# Patient Record
Sex: Male | Born: 1965 | Race: White | Hispanic: No | Marital: Married
Health system: Southern US, Community
[De-identification: ages and names within clinical notes are randomized; demographics above are authoritative.]

---

## 2010-03-15 ENCOUNTER — Encounter
Admission: RE | Admit: 2010-03-15 | Discharge: 2010-03-15 | Payer: Self-pay | Source: Home / Self Care | Attending: Internal Medicine | Admitting: Internal Medicine

## 2010-03-15 ENCOUNTER — Other Ambulatory Visit
Admission: RE | Admit: 2010-03-15 | Discharge: 2010-03-15 | Payer: Self-pay | Source: Home / Self Care | Admitting: Diagnostic Radiology

## 2011-03-11 ENCOUNTER — Other Ambulatory Visit: Payer: Self-pay | Admitting: Internal Medicine

## 2011-03-11 DIAGNOSIS — E049 Nontoxic goiter, unspecified: Secondary | ICD-10-CM

## 2011-04-11 ENCOUNTER — Ambulatory Visit
Admission: RE | Admit: 2011-04-11 | Discharge: 2011-04-11 | Disposition: A | Discharge status: Home / Self Care | Payer: BC Managed Care – PPO | Source: Ambulatory Visit | Attending: Internal Medicine | Admitting: Internal Medicine

## 2011-04-11 DIAGNOSIS — E049 Nontoxic goiter, unspecified: Secondary | ICD-10-CM

## 2011-07-13 IMAGING — US US SOFT TISSUE HEAD/NECK
1 series · 13 of 25 positions shown · non-contrast
Comparison: None.

CLINICAL DATA: 44-year-old with palpable thyroid mass.  Scheduled
for biopsy.

THYROID ULTRASOUND
TECHNIQUE: Ultrasound examination of the thyroid gland and adjacent
soft tissues was performed.

[Series 1: us soft tissue head/neck · 0.07mm/px · 13 of 42 slices shown]
[im 1/42]
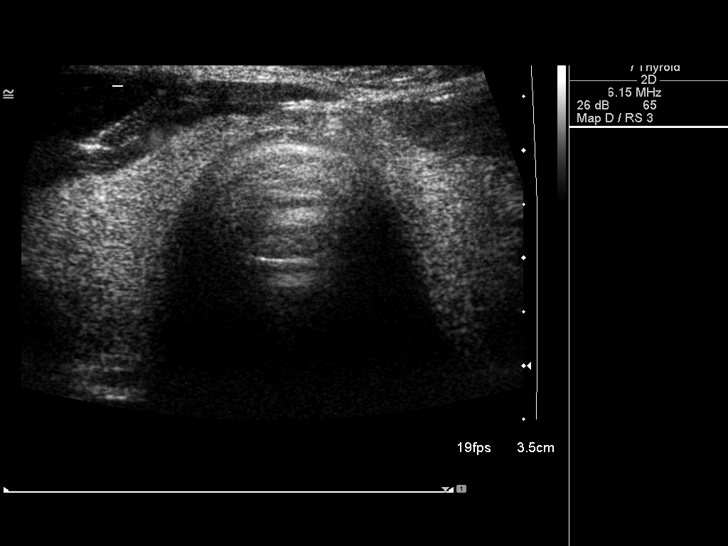
[im 4/42]
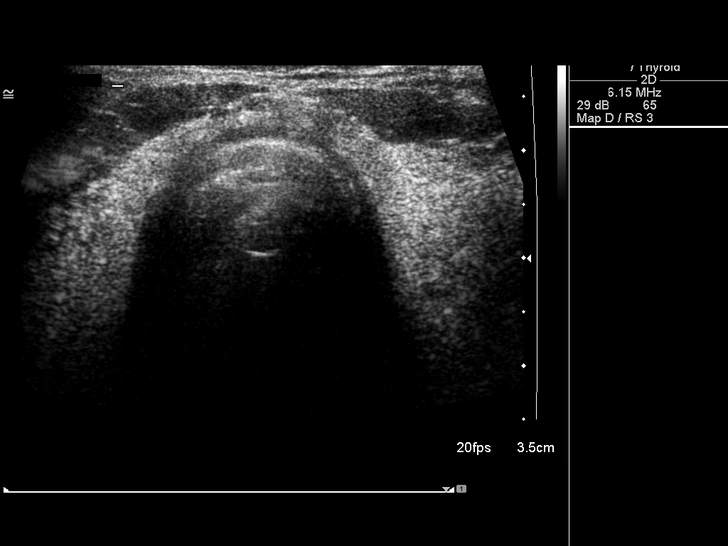
[im 7/42]
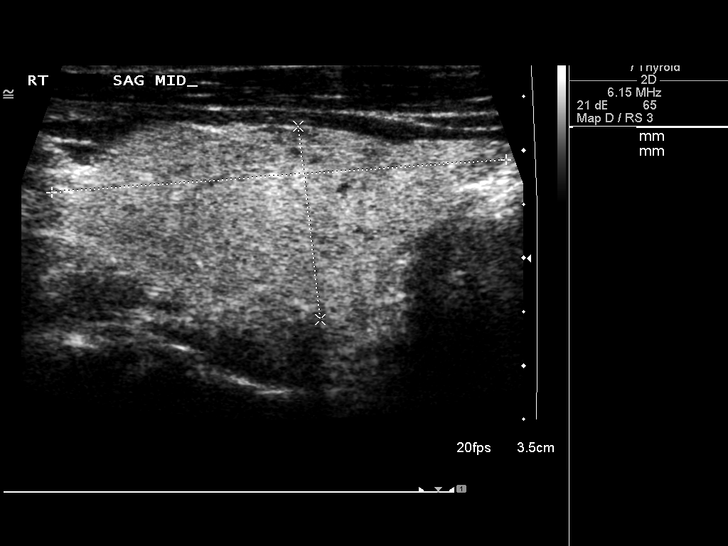
[im 11/42]
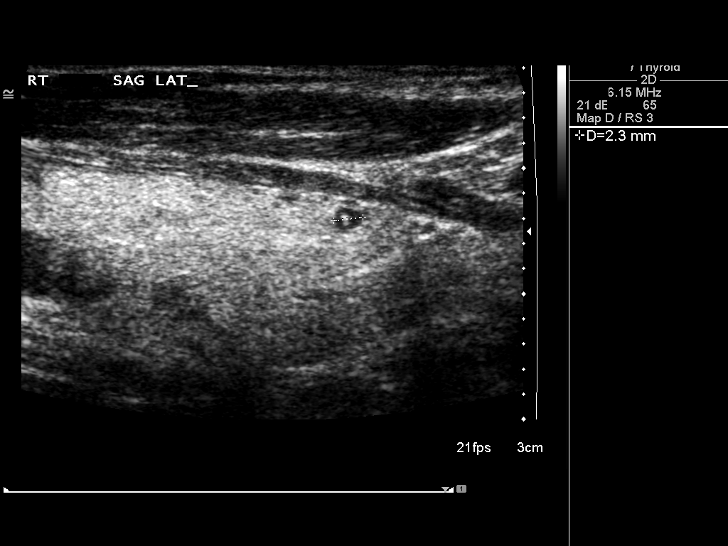
[im 14/42]
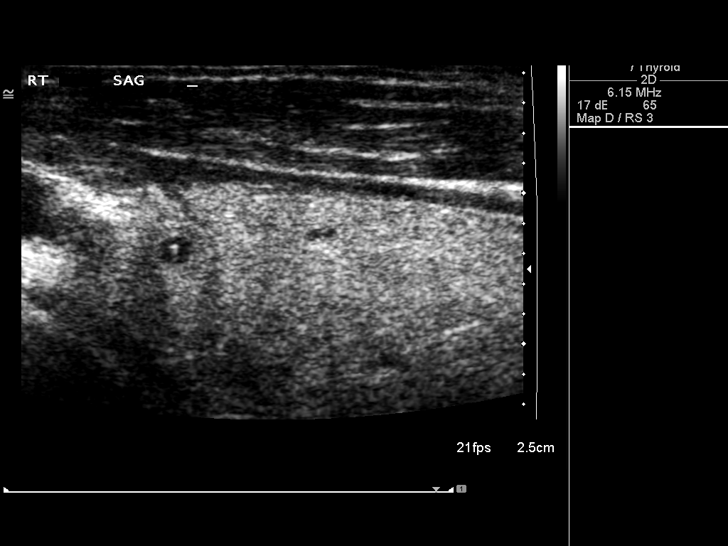
[im 18/42]
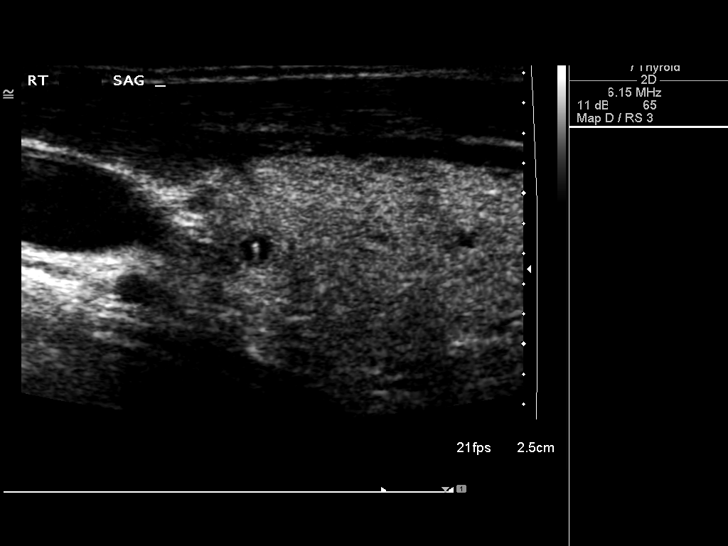
[im 21/42]
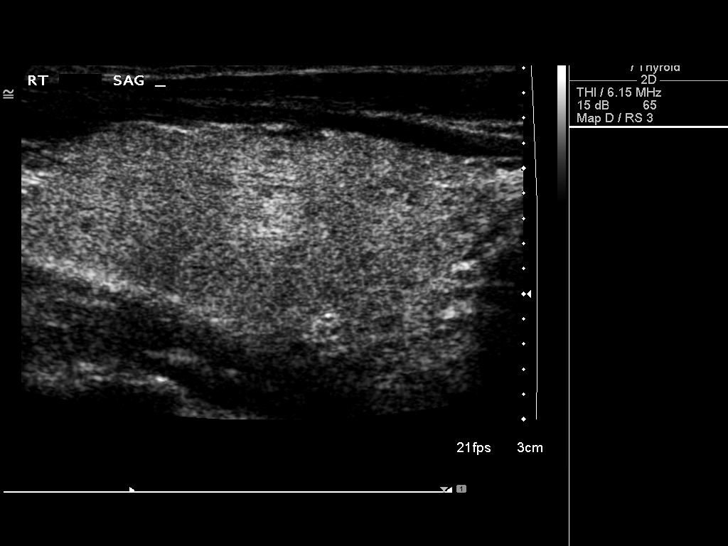
[im 24/42]
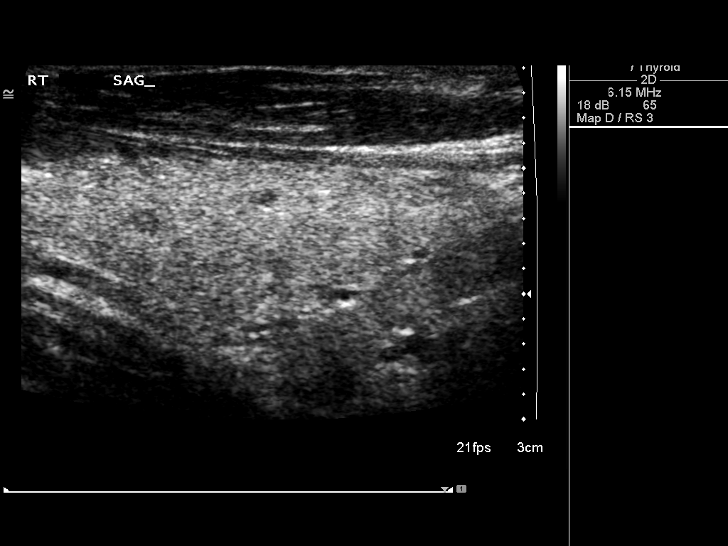
[im 28/42]
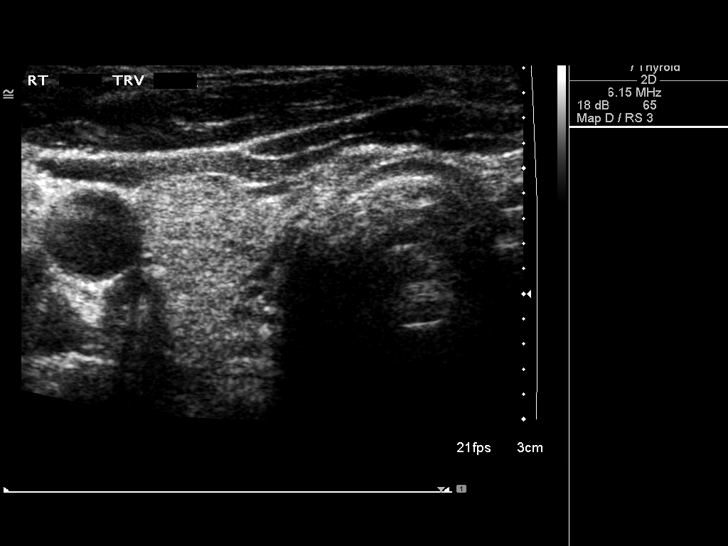
[im 31/42]
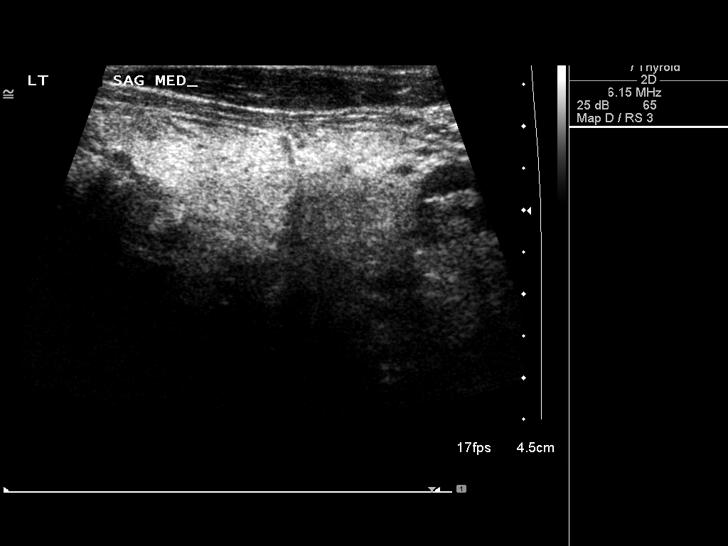
[im 35/42]
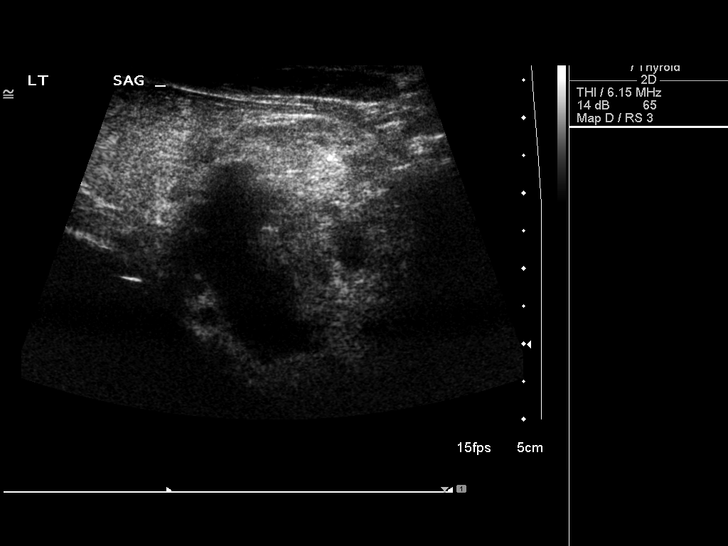
[im 38/42]
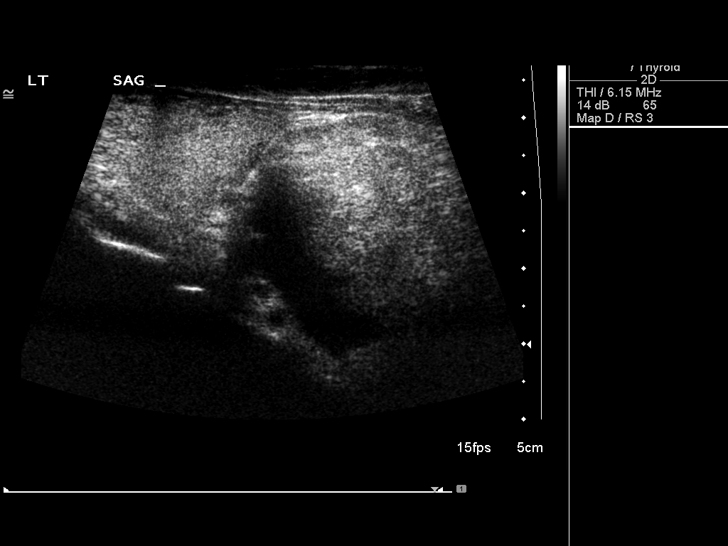
[im 42/42]
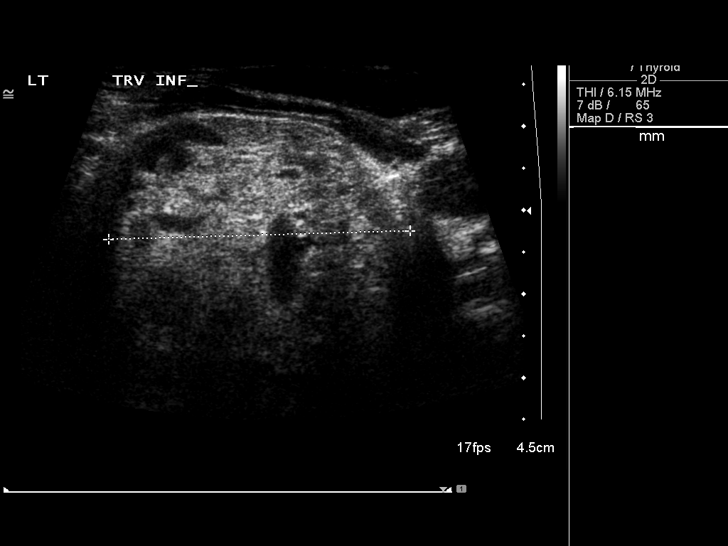

[13 of 25 positions shown; findings below may reference images not displayed]

FINDINGS: Right thyroid lobe:  Measures 4.2 x 1.8 x 1.7 cm.  The right
thyroid tissue is heterogeneous.  Small scattered nodules in the
right thyroid lobe.

Left thyroid lobe:  Measures 6.9 x 4.0 x 2.0 cm
Isthmus:  0.3 cm

Focal nodules:  Largest right thyroid nodule is along the superior
aspect and measures 0.8 x 0.6 x 0.7 cm.  This nodule is
heterogeneous and ill-defined.  There is a dominant nodule along
the inferior left thyroid lobe that extends into the retrosternal
space.  This is large solid nodule has a cystic component.  No
significant calcifications within the nodule.  The left thyroid
nodule measures 4.3 x 3.5 x 3.6 cm.

Lymphadenopathy:  None visualized.
IMPRESSION: Bilateral thyroid nodules.  There is a dominant solid nodule in the
left thyroid lobe that meets the criteria for fine needle
aspiration.

Multiple right thyroid nodules and the largest measures up to
cm.

This recommendation follows the consensus statement:  Management of
Thyroid Nodules Detected as US:  Society of Radiologists in
800.  Available online at :
[URL]

## 2011-10-25 ENCOUNTER — Other Ambulatory Visit (HOSPITAL_COMMUNITY): Payer: Self-pay | Admitting: Endocrinology

## 2011-10-25 DIAGNOSIS — E049 Nontoxic goiter, unspecified: Secondary | ICD-10-CM

## 2011-10-27 ENCOUNTER — Encounter (HOSPITAL_COMMUNITY)
Admission: RE | Admit: 2011-10-27 | Discharge: 2011-10-27 | Disposition: A | Discharge status: Home / Self Care | Payer: BC Managed Care – PPO | Source: Ambulatory Visit | Attending: Endocrinology | Admitting: Endocrinology

## 2011-10-27 DIAGNOSIS — E041 Nontoxic single thyroid nodule: Secondary | ICD-10-CM | POA: Insufficient documentation

## 2011-10-27 DIAGNOSIS — E049 Nontoxic goiter, unspecified: Secondary | ICD-10-CM

## 2011-10-27 MED ORDER — SODIUM PERTECHNETATE TC 99M INJECTION
10.0000 | Freq: Once | INTRAVENOUS | Status: AC | PRN
  Administered 2011-10-27: 10 via INTRAVENOUS

## 2016-05-02 DIAGNOSIS — F411 Generalized anxiety disorder: Secondary | ICD-10-CM | POA: Diagnosis not present

## 2016-05-02 DIAGNOSIS — Z8781 Personal history of (healed) traumatic fracture: Secondary | ICD-10-CM | POA: Diagnosis not present

## 2016-05-02 DIAGNOSIS — M545 Low back pain: Secondary | ICD-10-CM | POA: Diagnosis not present

## 2016-05-02 DIAGNOSIS — Z0001 Encounter for general adult medical examination with abnormal findings: Secondary | ICD-10-CM | POA: Diagnosis not present

## 2016-05-02 DIAGNOSIS — Z1212 Encounter for screening for malignant neoplasm of rectum: Secondary | ICD-10-CM | POA: Diagnosis not present

## 2016-12-01 DIAGNOSIS — F320 Major depressive disorder, single episode, mild: Secondary | ICD-10-CM | POA: Diagnosis not present

## 2016-12-01 DIAGNOSIS — M7072 Other bursitis of hip, left hip: Secondary | ICD-10-CM | POA: Diagnosis not present

## 2016-12-01 DIAGNOSIS — E039 Hypothyroidism, unspecified: Secondary | ICD-10-CM | POA: Diagnosis not present

## 2016-12-01 DIAGNOSIS — M25552 Pain in left hip: Secondary | ICD-10-CM | POA: Diagnosis not present

## 2016-12-07 DIAGNOSIS — G8929 Other chronic pain: Secondary | ICD-10-CM | POA: Diagnosis not present

## 2016-12-07 DIAGNOSIS — M25522 Pain in left elbow: Secondary | ICD-10-CM | POA: Diagnosis not present

## 2016-12-07 DIAGNOSIS — M25552 Pain in left hip: Secondary | ICD-10-CM | POA: Diagnosis not present

## 2017-01-04 DIAGNOSIS — E039 Hypothyroidism, unspecified: Secondary | ICD-10-CM | POA: Diagnosis not present

## 2017-01-04 DIAGNOSIS — M1611 Unilateral primary osteoarthritis, right hip: Secondary | ICD-10-CM | POA: Diagnosis not present

## 2017-01-04 DIAGNOSIS — F320 Major depressive disorder, single episode, mild: Secondary | ICD-10-CM | POA: Diagnosis not present

## 2017-01-04 DIAGNOSIS — Z23 Encounter for immunization: Secondary | ICD-10-CM | POA: Diagnosis not present

## 2017-02-14 DIAGNOSIS — E890 Postprocedural hypothyroidism: Secondary | ICD-10-CM | POA: Diagnosis not present

## 2017-02-14 DIAGNOSIS — F411 Generalized anxiety disorder: Secondary | ICD-10-CM | POA: Diagnosis not present

## 2017-05-04 DIAGNOSIS — Z0001 Encounter for general adult medical examination with abnormal findings: Secondary | ICD-10-CM | POA: Diagnosis not present

## 2017-05-04 DIAGNOSIS — N401 Enlarged prostate with lower urinary tract symptoms: Secondary | ICD-10-CM | POA: Diagnosis not present

## 2017-05-04 DIAGNOSIS — E890 Postprocedural hypothyroidism: Secondary | ICD-10-CM | POA: Diagnosis not present

## 2017-05-09 DIAGNOSIS — M545 Low back pain: Secondary | ICD-10-CM | POA: Diagnosis not present

## 2017-05-09 DIAGNOSIS — Z1212 Encounter for screening for malignant neoplasm of rectum: Secondary | ICD-10-CM | POA: Diagnosis not present

## 2017-05-09 DIAGNOSIS — F320 Major depressive disorder, single episode, mild: Secondary | ICD-10-CM | POA: Diagnosis not present

## 2017-05-09 DIAGNOSIS — Z0001 Encounter for general adult medical examination with abnormal findings: Secondary | ICD-10-CM | POA: Diagnosis not present

## 2017-05-09 DIAGNOSIS — F411 Generalized anxiety disorder: Secondary | ICD-10-CM | POA: Diagnosis not present

## 2017-05-09 DIAGNOSIS — L570 Actinic keratosis: Secondary | ICD-10-CM | POA: Diagnosis not present

## 2017-05-09 DIAGNOSIS — M40294 Other kyphosis, thoracic region: Secondary | ICD-10-CM | POA: Diagnosis not present

## 2017-09-20 DIAGNOSIS — M47816 Spondylosis without myelopathy or radiculopathy, lumbar region: Secondary | ICD-10-CM | POA: Diagnosis not present

## 2017-09-20 DIAGNOSIS — M9903 Segmental and somatic dysfunction of lumbar region: Secondary | ICD-10-CM | POA: Diagnosis not present

## 2017-10-03 DIAGNOSIS — M47816 Spondylosis without myelopathy or radiculopathy, lumbar region: Secondary | ICD-10-CM | POA: Diagnosis not present

## 2017-10-03 DIAGNOSIS — M9903 Segmental and somatic dysfunction of lumbar region: Secondary | ICD-10-CM | POA: Diagnosis not present

## 2017-10-05 DIAGNOSIS — M47816 Spondylosis without myelopathy or radiculopathy, lumbar region: Secondary | ICD-10-CM | POA: Diagnosis not present

## 2017-10-05 DIAGNOSIS — M9903 Segmental and somatic dysfunction of lumbar region: Secondary | ICD-10-CM | POA: Diagnosis not present

## 2017-10-10 DIAGNOSIS — M47816 Spondylosis without myelopathy or radiculopathy, lumbar region: Secondary | ICD-10-CM | POA: Diagnosis not present

## 2017-10-10 DIAGNOSIS — M9903 Segmental and somatic dysfunction of lumbar region: Secondary | ICD-10-CM | POA: Diagnosis not present

## 2017-10-17 DIAGNOSIS — M47816 Spondylosis without myelopathy or radiculopathy, lumbar region: Secondary | ICD-10-CM | POA: Diagnosis not present

## 2017-10-17 DIAGNOSIS — M9903 Segmental and somatic dysfunction of lumbar region: Secondary | ICD-10-CM | POA: Diagnosis not present

## 2017-10-18 DIAGNOSIS — M47816 Spondylosis without myelopathy or radiculopathy, lumbar region: Secondary | ICD-10-CM | POA: Diagnosis not present

## 2017-10-18 DIAGNOSIS — M9903 Segmental and somatic dysfunction of lumbar region: Secondary | ICD-10-CM | POA: Diagnosis not present

## 2017-10-23 DIAGNOSIS — M47816 Spondylosis without myelopathy or radiculopathy, lumbar region: Secondary | ICD-10-CM | POA: Diagnosis not present

## 2017-10-23 DIAGNOSIS — M9903 Segmental and somatic dysfunction of lumbar region: Secondary | ICD-10-CM | POA: Diagnosis not present

## 2017-10-25 DIAGNOSIS — M9903 Segmental and somatic dysfunction of lumbar region: Secondary | ICD-10-CM | POA: Diagnosis not present

## 2017-10-25 DIAGNOSIS — M47816 Spondylosis without myelopathy or radiculopathy, lumbar region: Secondary | ICD-10-CM | POA: Diagnosis not present

## 2017-11-06 DIAGNOSIS — M47816 Spondylosis without myelopathy or radiculopathy, lumbar region: Secondary | ICD-10-CM | POA: Diagnosis not present

## 2017-11-06 DIAGNOSIS — M9903 Segmental and somatic dysfunction of lumbar region: Secondary | ICD-10-CM | POA: Diagnosis not present

## 2017-11-07 DIAGNOSIS — S61215A Laceration without foreign body of left ring finger without damage to nail, initial encounter: Secondary | ICD-10-CM | POA: Diagnosis not present

## 2017-11-08 DIAGNOSIS — M47816 Spondylosis without myelopathy or radiculopathy, lumbar region: Secondary | ICD-10-CM | POA: Diagnosis not present

## 2017-11-08 DIAGNOSIS — M9903 Segmental and somatic dysfunction of lumbar region: Secondary | ICD-10-CM | POA: Diagnosis not present

## 2017-11-14 DIAGNOSIS — S61214D Laceration without foreign body of right ring finger without damage to nail, subsequent encounter: Secondary | ICD-10-CM | POA: Diagnosis not present

## 2017-11-14 DIAGNOSIS — M9903 Segmental and somatic dysfunction of lumbar region: Secondary | ICD-10-CM | POA: Diagnosis not present

## 2017-11-14 DIAGNOSIS — M47816 Spondylosis without myelopathy or radiculopathy, lumbar region: Secondary | ICD-10-CM | POA: Diagnosis not present

## 2017-11-20 DIAGNOSIS — M47816 Spondylosis without myelopathy or radiculopathy, lumbar region: Secondary | ICD-10-CM | POA: Diagnosis not present

## 2017-11-20 DIAGNOSIS — M9903 Segmental and somatic dysfunction of lumbar region: Secondary | ICD-10-CM | POA: Diagnosis not present

## 2017-11-29 DIAGNOSIS — M47816 Spondylosis without myelopathy or radiculopathy, lumbar region: Secondary | ICD-10-CM | POA: Diagnosis not present

## 2017-11-29 DIAGNOSIS — M9903 Segmental and somatic dysfunction of lumbar region: Secondary | ICD-10-CM | POA: Diagnosis not present

## 2017-12-11 DIAGNOSIS — M47816 Spondylosis without myelopathy or radiculopathy, lumbar region: Secondary | ICD-10-CM | POA: Diagnosis not present

## 2017-12-11 DIAGNOSIS — M9903 Segmental and somatic dysfunction of lumbar region: Secondary | ICD-10-CM | POA: Diagnosis not present

## 2017-12-25 ENCOUNTER — Ambulatory Visit (INDEPENDENT_AMBULATORY_CARE_PROVIDER_SITE_OTHER): Payer: BLUE CROSS/BLUE SHIELD | Admitting: Orthopaedic Surgery

## 2017-12-25 ENCOUNTER — Other Ambulatory Visit (INDEPENDENT_AMBULATORY_CARE_PROVIDER_SITE_OTHER): Payer: Self-pay

## 2017-12-25 ENCOUNTER — Encounter (INDEPENDENT_AMBULATORY_CARE_PROVIDER_SITE_OTHER): Payer: Self-pay | Admitting: Orthopaedic Surgery

## 2017-12-25 ENCOUNTER — Ambulatory Visit (INDEPENDENT_AMBULATORY_CARE_PROVIDER_SITE_OTHER): Payer: Self-pay

## 2017-12-25 DIAGNOSIS — M5432 Sciatica, left side: Secondary | ICD-10-CM

## 2017-12-25 DIAGNOSIS — M25552 Pain in left hip: Secondary | ICD-10-CM

## 2017-12-25 DIAGNOSIS — M79605 Pain in left leg: Secondary | ICD-10-CM

## 2017-12-25 DIAGNOSIS — M4807 Spinal stenosis, lumbosacral region: Secondary | ICD-10-CM

## 2017-12-25 NOTE — Progress Notes (Signed)
Office Visit Note   Patient: Matthew Ayala           Date of Birth: 18-Aug-1965           MRN: [id number] Visit Date: 12/25/2017              Requested by: No referring provider defined for this encounter. PCP: [facility]   Assessment & Plan: Visit Diagnoses:  1. Pain in left hip   2. Pain in left leg   3. Sciatica, left side     Plan: Based on his signs and symptoms and clinical exam I do feel this is more lumbar spine related.  He is already tried and failed all forms of conservative treatment for over a year now including therapy with a chiropractor, activity modification, anti-inflammatories and rest.  He continues to have the neurologic symptoms going on his left leg and at this point an MRI is warranted to rule out nerve compression.  All question concerns were answered and addressed.  We will see him back once we have the MRI of his lumbar spine.  Follow-Up Instructions: Return in about 2 weeks (around 01/08/2018).   Orders:  Orders Placed This Encounter  Procedures  . XR HIP UNILAT W OR W/O PELVIS 1V LEFT   No orders of the defined types were placed in this encounter.     Procedures: No procedures performed   Clinical Data: No additional findings.   Subjective: Chief Complaint  Patient presents with  . Left Hip - Pain  The patient is a very pleasant 52 year old thin gentleman who is been dealing with left hip pain for about a year now.  However he describes it is not pain in the groin.  He did see a specialist in [place] for this and I put him on anti-inflammatories.  He has seen a chiropractor for over a year now and he does have recent x-rays that accompany him of his lumbar spine.  He denies any specific injuries.  It is gotten to where the pain is slowly getting worse and he states that it is detrimentally affecting his quality of life and his activities daily living.  He has been on anti-inflammatories and that has not helped.  In  spite of a number of chiropractic therapy treatments he continued to have problems with the pain.  He describes numbness and tingling that starts in the thigh area on the inner part of his thigh and goes all the way down to his foot.  He is not a diabetic he has no previous history of neuropathy.  He denies any weakness.  HPI  Review of Systems He currently denies any headache, chest pain, shortness of breath, fever, chills, nausea, vomiting.  Objective: Vital Signs: There were no vitals taken for this visit.  Physical Exam He is alert and oriented x3 and in no acute distress Ortho Exam Examination of both hips show normal with fluid range of motion of both hips with no pain at all.  He can easily cross his left leg over his right.  I can put him to the extremes of internal and external rotation of the left hip and this causes no pain in the groin at all.  He has no pain of the trochanteric area or the IT band.  He has no weakness in his leg but there is subjective numbness down the inner thigh to the outer part of his left leg.  He does have a positive straight leg raise.  This is on the left side. Specialty Comments:  No specialty comments available.  Imaging: Xr Hip Unilat W Or W/o Pelvis 1v Left  Result Date: 12/25/2017 An AP pelvis and lateral left hip shows no acute findings.  The joint space is well-maintained.  I was able to independently review x-rays of his lumbar spine that accompany him.  On the lateral view he does appear to have a transitional vertebra with 6 lumbar vertebra.  There is degenerative changes at that level.  PMFS History: There are no active problems to display for this patient.  History reviewed. No pertinent past medical history.  History reviewed. No pertinent family history.  History reviewed. No pertinent surgical history. Social History   Occupational History  . Not on file  Tobacco Use  . Smoking status: Not on file  Substance and Sexual Activity   . Alcohol use: Not on file  . Drug use: Not on file  . Sexual activity: Not on file

## 2017-12-27 ENCOUNTER — Telehealth (INDEPENDENT_AMBULATORY_CARE_PROVIDER_SITE_OTHER): Payer: Self-pay | Admitting: Orthopaedic Surgery

## 2017-12-27 NOTE — Telephone Encounter (Signed)
Patient called requesting that his MRI be moved to [facility] facility from [facility] due to him living in [place].  CB#[phone].  Thank you.

## 2017-12-29 NOTE — Telephone Encounter (Signed)
re-faxed

## 2017-12-29 NOTE — Telephone Encounter (Signed)
Please refax

## 2017-12-29 NOTE — Telephone Encounter (Signed)
[facility] [place]  [phone] MRI order    Facility stated they cant find fax please fax to number provided above. See message below it was sent on 2nd of October.

## 2018-01-02 DIAGNOSIS — M48061 Spinal stenosis, lumbar region without neurogenic claudication: Secondary | ICD-10-CM | POA: Diagnosis not present

## 2018-01-09 ENCOUNTER — Ambulatory Visit (INDEPENDENT_AMBULATORY_CARE_PROVIDER_SITE_OTHER): Payer: BLUE CROSS/BLUE SHIELD | Admitting: Orthopaedic Surgery

## 2018-01-09 ENCOUNTER — Other Ambulatory Visit (INDEPENDENT_AMBULATORY_CARE_PROVIDER_SITE_OTHER): Payer: Self-pay

## 2018-01-09 ENCOUNTER — Encounter (INDEPENDENT_AMBULATORY_CARE_PROVIDER_SITE_OTHER): Payer: Self-pay | Admitting: Orthopaedic Surgery

## 2018-01-09 VITALS — Ht 72.0 in | Wt 153.0 lb

## 2018-01-09 DIAGNOSIS — M5442 Lumbago with sciatica, left side: Secondary | ICD-10-CM

## 2018-01-09 DIAGNOSIS — G8929 Other chronic pain: Secondary | ICD-10-CM

## 2018-01-09 DIAGNOSIS — M545 Low back pain: Principal | ICD-10-CM

## 2018-01-09 NOTE — Progress Notes (Signed)
HPI: Mr. Matthew Ayala returns today for follow-up of his lumbar spine MRI.  He had MRI performed imaging center with weight for his [facility] actual images are not available.  Only the report is available. Patient continues to have left hip pain that radiates down to the medial aspect of the lower leg does not go into his foot.  He states the pain begins after he is been up standing or walking for 2 or 3 hours.  He has no pain when lying down or sitting.  States he is able to ride long distances without any hip or back pain.  Once his pain does begin he sits down his pain usually goes away.  He denies any waking pain.  Denies any bowel bladder dysfunction.  MRI is reviewed lesion of the lumbar spine for visualization purposes.  MRI of the lumbar spine dated 01/02/2018 showed L4-5 broad-based disc bulge and right subarticular disc protrusion annular fissure and facet hypertrophy with mild bilateral foraminal stenosis and moderate right and mild left narrowing of the lateral recess.  Physical exam: Full range of motion bilateral hips without pain.  Negative straight leg raise bilaterally.  Hamstrings are tight bilaterally.  5 out of 5 strength throughout lower extremities against resistance.  Sensation intact bilateral feet to light touch.  Dorsal pedal pulse and posterior tibial pulses are present and equal bilaterally. 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  Plan: We will send him for epidural steroid injections/L4-5 facet injection on the left with Dr. Alvester Morin.  I did explain to him that he will need to bring the disc with the images to his appointment for Dr. Alvester Morin to have before doing the procedure.  Hopefully this will be both therapeutic and diagnostic.  He will follow-up with Dr. Magnus Ivan in 1 month.  Questions were encouraged and answered.

## 2018-01-24 ENCOUNTER — Ambulatory Visit (INDEPENDENT_AMBULATORY_CARE_PROVIDER_SITE_OTHER): Payer: BLUE CROSS/BLUE SHIELD | Admitting: Physical Medicine and Rehabilitation

## 2018-01-24 ENCOUNTER — Encounter (INDEPENDENT_AMBULATORY_CARE_PROVIDER_SITE_OTHER): Payer: Self-pay | Admitting: Physical Medicine and Rehabilitation

## 2018-01-24 ENCOUNTER — Ambulatory Visit (INDEPENDENT_AMBULATORY_CARE_PROVIDER_SITE_OTHER): Payer: Self-pay

## 2018-01-24 VITALS — BP 130/91 | HR 47 | Temp 97.8°F

## 2018-01-24 DIAGNOSIS — M47816 Spondylosis without myelopathy or radiculopathy, lumbar region: Secondary | ICD-10-CM

## 2018-01-24 DIAGNOSIS — M5416 Radiculopathy, lumbar region: Secondary | ICD-10-CM | POA: Diagnosis not present

## 2018-01-24 DIAGNOSIS — R202 Paresthesia of skin: Secondary | ICD-10-CM

## 2018-01-24 DIAGNOSIS — M25552 Pain in left hip: Secondary | ICD-10-CM | POA: Diagnosis not present

## 2018-01-24 MED ORDER — METHYLPREDNISOLONE ACETATE 80 MG/ML IJ SUSP
80.0000 mg | Freq: Once | INTRAMUSCULAR | Status: AC
  Administered 2018-01-24: 80 mg

## 2018-01-24 NOTE — Procedures (Signed)
Lumbar Facet Joint Intra-Articular Injection(s) with Fluoroscopic Guidance  Patient: Matthew Ayala      Date of Birth: Jun 07, 1965 MRN: [id number] PCP: [facility]      Visit Date: 01/24/2018   Universal Protocol:    Date/Time: 01/24/2018  Consent Given By: the patient  Position: PRONE   Additional Comments: Vital signs were monitored before and after the procedure. Patient was prepped and draped in the usual sterile fashion. The correct patient, procedure, and site was verified.   Injection Procedure Details:  Procedure Site One Meds Administered:  Meds ordered this encounter  Medications  . methylPREDNISolone acetate (DEPO-MEDROL) injection 80 mg     Laterality: Left  Location/Site:  L4-L5  Needle size: 22 guage  Needle type: Spinal  Needle Placement: Articular  Findings:  -Comments: Excellent flow of contrast producing a partial arthrogram.  Procedure Details: The fluoroscope beam is vertically oriented in AP, and the inferior recess is visualized beneath the lower pole of the inferior apophyseal process, which represents the target point for needle insertion. When direct visualization is difficult the target point is located at the medial projection of the vertebral pedicle. The region overlying each aforementioned target is locally anesthetized with a 1 to 2 ml. volume of 1% Lidocaine without Epinephrine.   The spinal needle was inserted into each of the above mentioned facet joints using biplanar fluoroscopic guidance. A 0.25 to 0.5 ml. volume of Isovue-250 was injected and a partial facet joint arthrogram was obtained. A single spot film was obtained of the resulting arthrogram.    One to 1.25 ml of the steroid/anesthetic solution was then injected into each of the facet joints noted above.   Additional Comments:  The patient tolerated the procedure well Dressing: Band-Aid    Post-procedure details: Patient was observed during the  procedure. Post-procedure instructions were reviewed.  Patient left the clinic in stable condition.

## 2018-01-24 NOTE — Progress Notes (Signed)
 .  Numeric Pain Rating Scale and Functional Assessment Average Pain 8   In the last MONTH (on 0-10 scale) has pain interfered with the following?  1. General activity like being  able to carry out your everyday physical activities such as walking, climbing stairs, carrying groceries, or moving a chair?  Rating(4)   +Driver, -BT, -Dye Allergies.  

## 2018-01-24 NOTE — Progress Notes (Signed)
Matthew Ayala - 52 y.o. male MRN [id number]  Date of birth: January 13, 1966  Office Visit Note: Visit Date: 01/24/2018 PCP: [facility] Referred by: [facility]  Subjective: Chief Complaint  Patient presents with  . Lower Back - Pain  . Left Hip - Pain  . Left Leg - Numbness   HPI: Matthew Ayala is a 52 y.o. male who comes in today Evaluation management and possible spine intervention for chronic worsening left hip pain with some tingling numbness in the medial left leg.  He comes in today at the request of Dr. Doneen Poisson in our office.  His history is such that he has had several years of left anterior hip pain that he feels like feels like it is in the ball-and-socket joint.  He denies any real lateral or posterior pain.  Not much in the way of back pain.  He has been having this with no specific injury.  Is worse when he stands for a long time and after he is up for a few hours it really is detrimental to his daily activities.  He finds himself limping.  More recently he has had this tingling paresthesia type sensation that he does not feel is very painful or problematic other than just being there but that starts in the groin area and goes down the medial aspect of the leg to the medial foot.  This really appears to be more of a distribution of the femoral or saphenous nerve.  He gets no nerve pain or tingling in the posterior lateral area none in the calf and bottom of the foot.  He has had no right-sided complaints.  For exercise he does a lot of stationary bike riding and this does not exacerbate his symptoms at all.  Is predominantly with weightbearing.  He has had x-rays of the hip that show mild degenerative change.  He initially saw an orthopedic specialist at [facility] Dr. Randall An who started anti-inflammatories.  He has been to see a chiropractor and has had activity modification.  Dr. Magnus Ivan examined his hip and felt like he had good range of  motion and felt like it was more related to his lumbar spine.  MRI of the lumbar spine was performed and this is reviewed with the patient today.  This basically shows disc protrusion herniation with annular tear at L4-5 but is predominantly right-sided paracentral and there is some facet arthropathy bilaterally at L4-5.  No focal nerve compression or other issue.  He has not had an MRI of his hip or MRI arthrogram or diagnostic hip injection.  Review of Systems  Constitutional: Negative for chills, fever, malaise/fatigue and weight loss.  HENT: Negative for hearing loss and sinus pain.   Eyes: Negative for blurred vision, double vision and photophobia.  Respiratory: Negative for cough and shortness of breath.   Cardiovascular: Negative for chest pain, palpitations and leg swelling.  Gastrointestinal: Negative for abdominal pain, nausea and vomiting.  Genitourinary: Negative for flank pain.  Musculoskeletal: Positive for joint pain. Negative for myalgias.  Skin: Negative for itching and rash.  Neurological: Positive for tingling. Negative for tremors, focal weakness and weakness.  Endo/Heme/Allergies: Negative.   Psychiatric/Behavioral: Negative for depression.  All other systems reviewed and are negative.  Otherwise per HPI.  Assessment & Plan: Visit Diagnoses:  1. 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 Lumbar radiculopathy   4. Paresthesia of skin  Plan: Findings:  Chronic worsening severe at times left anterior hip pain with more recent paresthesia in the medial leg seemingly more consistent with either a femoral or saphenous nerve distribution more than radicular type distribution.  He could have an L5 or even S1 distribution pain that could be similar as I have seen that on occasion.  He does not really relate the anterior hip pain with the symptoms going down the medial leg.  They can occur at different times.  His main complaint  is the anterior pain.  Exam again shows painless range of motion.  He has some stiffness with extension of the lumbar spine he has good distal strength.  I am not sure what totally to make of his pain complaints other than I have seen lumbar facet joints refer pain anteriorly from the back to the front.  I have seen that with trying to inject the joint and have the patient feel referred pain to the front of actually had as a symptom as well.  I think the best approach is a diagnostic facet joint block.  I would recommend to Dr. Magnus Ivan on return visit depending on his relief to potentially consider MRI of the pelvis given the nerve distribution that could be more related to a femoral nerve and even question any evidence of any type of hernia.  This was not seen on exam.  His symptoms have not been relieved with general conservative care.    Meds & Orders:  Meds ordered this encounter  Medications  . methylPREDNISolone acetate (DEPO-MEDROL) injection 80 mg    Orders Placed This Encounter  Procedures  . Facet Injection  . XR C-ARM NO REPORT    Follow-up: Return for Consider hip injection.   Procedures: No procedures performed  Lumbar Facet Joint Intra-Articular Injection(s) with Fluoroscopic Guidance  Patient: Matthew Ayala      Date of Birth: 08/27/1965 MRN: [id number] PCP: [facility]      Visit Date: 01/24/2018   Universal Protocol:    Date/Time: 01/24/2018  Consent Given By: the patient  Position: PRONE   Additional Comments: Vital signs were monitored before and after the procedure. Patient was prepped and draped in the usual sterile fashion. The correct patient, procedure, and site was verified.   Injection Procedure Details:  Procedure Site One Meds Administered:  Meds ordered this encounter  Medications  . methylPREDNISolone acetate (DEPO-MEDROL) injection 80 mg     Laterality: Left  Location/Site:  L4-L5  Needle size: 22 guage  Needle  type: Spinal  Needle Placement: Articular  Findings:  -Comments: Excellent flow of contrast producing a partial arthrogram.  Procedure Details: The fluoroscope beam is vertically oriented in AP, and the inferior recess is visualized beneath the lower pole of the inferior apophyseal process, which represents the target point for needle insertion. When direct visualization is difficult the target point is located at the medial projection of the vertebral pedicle. The region overlying each aforementioned target is locally anesthetized with a 1 to 2 ml. volume of 1% Lidocaine without Epinephrine.   The spinal needle was inserted into each of the above mentioned facet joints using biplanar fluoroscopic guidance. A 0.25 to 0.5 ml. volume of Isovue-250 was injected and a partial facet joint arthrogram was obtained. A single spot film was obtained of the resulting arthrogram.    One to 1.25 ml of the steroid/anesthetic solution was then injected into each of the facet joints noted above.   Additional Comments:  The  patient tolerated the procedure well Dressing: Band-Aid    Post-procedure details: Patient was observed during the procedure. Post-procedure instructions were reviewed.  Patient left the clinic in stable condition.    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   TECHNIQUE: Multiplanar, multi-sequence surface-coil MR imaging of the lumbar spine was performed without contrast.   LEVELS IMAGED: Lower thoracic to upper sacral region.   FINDINGS:  Alignment: No acute traumatic malalignment. Vertebral body heights maintained. 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  Vertebrae: No marrow signal abnormalities to suggest neoplasm.  Conus: Terminates at L1. Normal signal and contour.  Degenerative Changes:     T12-L1: No focal  abnormality.    L1-L2: No focal abnormality.    L2-L3: Mild disc desiccation with small broad-based disc bulge and facet hypertrophy without canal or foraminal stenosis.    L3-L4: Broad-based disc bulge and facet hypertrophy without canal or foraminal stenosis.    L4-L5: Mild disc height loss with broad-based disc bulge and right subarticular disc protrusion/annular fissure facet hypertrophy, ligamentum flavum thickening with mild bilateral foraminal stenosis and moderate right and mild left narrowing of the lateral recesses.     L5-S1: Broad-based disc bulge without canal or foraminal stenosis.  Upper sacrum/ilium: No focal abnormality.  CONCLUSION:  Multilevel degenerative changes of the lumbar spine with a superimposed right subarticular disc protrusion/annular fissure at L4-L5 which contributes to moderate narrowing of the right lateral recess. There is also mild bilateral foraminal stenosis and mild narrowing of the left lateral recess at this level. No canal stenosis.   He has no tobacco history on file. No results for input(s): HGBA1C, LABURIC in the last 8760 hours.  Objective:  VS:  HT:    WT:   BMI:     BP:(!) 130/91  HR:(!) 47bpm  TEMP:97.8 F (36.6 C)(Oral)  RESP:  Physical Exam  Constitutional: He is oriented to person, place, and time. He appears well-developed and well-nourished. No distress.  HENT:  Head: Normocephalic and atraumatic.  Eyes: Pupils are equal, round, and reactive to light. Conjunctivae are normal.  Neck: Normal range of motion. Neck supple.  Cardiovascular: Regular rhythm and intact distal pulses.  Pulmonary/Chest: Effort normal. No respiratory distress.  Musculoskeletal:  Painless range of motion of both hips.  No pain over the greater trochanter.  Some stiffness of lumbar spine with good distal strength.  Neurological: He is alert and oriented to person, place, and time. He exhibits normal muscle tone. Coordination normal.  Skin: Skin is warm and  dry. No rash noted. No erythema.  Psychiatric: He has a normal mood and affect.  Nursing note and vitals reviewed.   Ortho Exam Imaging: No results found.  Past Medical/Family/Surgical/Social History: Medications & Allergies reviewed per EMR, new medications updated. There are no active problems to display for this patient.  History reviewed. No pertinent past medical history. History reviewed. No pertinent family history. History reviewed. No pertinent surgical history. Social History   Occupational History  . Not on file  Tobacco Use  . Smoking status: Unknown If Ever Smoked  Substance and Sexual Activity  . Alcohol use: Never    Frequency: Never  . Drug use: Never  . Sexual activity: Yes

## 2018-01-24 NOTE — Patient Instructions (Signed)

## 2018-02-19 ENCOUNTER — Encounter (INDEPENDENT_AMBULATORY_CARE_PROVIDER_SITE_OTHER): Payer: Self-pay | Admitting: Orthopaedic Surgery

## 2018-02-19 ENCOUNTER — Ambulatory Visit (INDEPENDENT_AMBULATORY_CARE_PROVIDER_SITE_OTHER): Payer: BLUE CROSS/BLUE SHIELD | Admitting: Orthopaedic Surgery

## 2018-02-19 DIAGNOSIS — M545 Low back pain: Secondary | ICD-10-CM | POA: Diagnosis not present

## 2018-02-19 DIAGNOSIS — G8929 Other chronic pain: Secondary | ICD-10-CM | POA: Diagnosis not present

## 2018-02-19 MED ORDER — METHYLPREDNISOLONE ACETATE 40 MG/ML IJ SUSP: 40.0000 mg | Freq: Once | INTRAMUSCULAR | Status: AC

## 2018-02-19 NOTE — Addendum Note (Signed)
Addended by: Lillia CarmelHILTS, Kenisha Lynds J on: 02/19/2018 04:54 PM   Modules accepted: Orders

## 2018-02-19 NOTE — Progress Notes (Signed)
The patient is following up after having a left-sided L4-L5 ESI by Dr. Alvester MorinNewton.  He says that helped his symptoms dramatically and is doing better overall but he still has "hip ball pain".  On examination of his left hip he has full fluid internal and external rotation with no pain in the groin at all and no pain on extremes of rotation.  He has no pain with compressing his hip.  X-rays in the past of his hip appears normal.  At this point given the fact that he is still having his hip ball pain I would like to have Dr. Prince RomeHilts see him to consider an intra-articular ultrasound-guided left hip injection.  I will then see him back myself in a month to see if this is helped.  All question concerns were answered and addressed.

## 2018-02-19 NOTE — Progress Notes (Signed)
Subjective: He is here with chronic left groin pain.  Epidural injection helped his back pain, but not his groin pain.  He is feeling pretty well this afternoon.  Objective: Good range of motion of his hip with no pain on extremes of internal or external rotation.  Procedure: Diagnostic/therapeutic left hip ultrasound-guided injection: After sterile prep with Betadine, injected 5 cc 1% lidocaine without epinephrine and 40 mg methylprednisolone using a 22-gauge spinal needle, passing the needle through the iliofemoral ligament into the femoral head/neck junction using ultrasound guidance.  Injectate was seen filling the joint capsule.  Follow-up in 1 month with Dr. Magnus IvanBlackman.

## 2018-02-28 ENCOUNTER — Telehealth (INDEPENDENT_AMBULATORY_CARE_PROVIDER_SITE_OTHER): Payer: Self-pay | Admitting: *Deleted

## 2018-02-28 ENCOUNTER — Other Ambulatory Visit (INDEPENDENT_AMBULATORY_CARE_PROVIDER_SITE_OTHER): Payer: Self-pay

## 2018-02-28 DIAGNOSIS — M25552 Pain in left hip: Secondary | ICD-10-CM

## 2018-02-28 NOTE — Telephone Encounter (Signed)
Please advise 

## 2018-02-28 NOTE — Telephone Encounter (Signed)
Pt called states was in 10 days ago and had gotten injection in his L hip and has not gotten any relief from the injection, pt says that Dr. Magnus IvanBlackman told him if not better then will get MRI, I see nothing in notes about MRI. Pt wants to go for MRI and would like to have [facility] done [facility] [place]. Please advise.   CB [phone]

## 2018-02-28 NOTE — Telephone Encounter (Signed)
Ok to MRI his hip to assess for any cartilage issues due to continued hip pain and the failure of an intra-articular injection.

## 2018-02-28 NOTE — Telephone Encounter (Signed)
Patient aware I have ordered MRI

## 2018-03-09 ENCOUNTER — Telehealth (INDEPENDENT_AMBULATORY_CARE_PROVIDER_SITE_OTHER): Payer: Self-pay

## 2018-03-09 NOTE — Telephone Encounter (Signed)
[facility]

## 2018-03-09 NOTE — Telephone Encounter (Signed)
Carollee HerterShannon with [facility] called stating that they are needing a sign order for MRI for patient.  Patient has appointment on Monday, 03/12/2018.  Fax# is [phone], CB# is [phone].  Please advise.  Thank You.

## 2018-03-12 DIAGNOSIS — M1612 Unilateral primary osteoarthritis, left hip: Secondary | ICD-10-CM | POA: Diagnosis not present

## 2018-03-19 ENCOUNTER — Ambulatory Visit (INDEPENDENT_AMBULATORY_CARE_PROVIDER_SITE_OTHER): Payer: BLUE CROSS/BLUE SHIELD | Admitting: Orthopaedic Surgery

## 2018-03-19 ENCOUNTER — Encounter (INDEPENDENT_AMBULATORY_CARE_PROVIDER_SITE_OTHER): Payer: Self-pay | Admitting: Orthopaedic Surgery

## 2018-03-19 DIAGNOSIS — M545 Low back pain, unspecified: Secondary | ICD-10-CM

## 2018-03-19 DIAGNOSIS — M25552 Pain in left hip: Secondary | ICD-10-CM | POA: Diagnosis not present

## 2018-03-19 DIAGNOSIS — G8929 Other chronic pain: Secondary | ICD-10-CM | POA: Diagnosis not present

## 2018-03-19 NOTE — Progress Notes (Signed)
Patient is here today to go over an MRI of his left hip.  He is very thin and active 52 year old gentleman is been having left hip pain.  We have also treated him for low back pain and he has had a left-sided L4-L5 epidural injection by Dr. Alvester MorinNewton.  Also had Dr. Prince RomeHilts provide an intra-articular injection in his left hip joint.  He says he gets clicking in his hip and he still has pain with it especially with activities.  He said the injection did not help at all and he cannot tell a difference.  He does have pain on his left hip when he rolls over on that at night.  He has minimal groin pain.  He does report clicking on occasion.  Examination of his left hip shows that has full internal and external rotation and no pain on compression and no pain on extremes of rotation.  I cannot create any kind of clicking or catching in his hip.  There is just some mild pain over the tip of the trochanteric area to palpation.  MRI of his left hip showed just some mild degenerative changes.  There is also some mild tendinosis of the gluteus medius and minimus tendon and some mild bursitis but no full-thickness cartilage wear no significant tearing of anything.  There is no hip joint effusion either.  There is no edema in the femoral head or acetabulum.  I did give him reassurance that I think he needs any type of surgery.  Do not really have anything surgical to offer him since he had no response to even the injection of his left hip joint.  He is a thin individual so weight loss is not an issue.  I will need to send outpatient physical therapy to see if they can help his back and his hip with various modalities to help things calm down.  [facility]  Follow-up will be in about 6 weeks.

## 2018-05-31 DIAGNOSIS — Z125 Encounter for screening for malignant neoplasm of prostate: Secondary | ICD-10-CM | POA: Diagnosis not present

## 2018-05-31 DIAGNOSIS — Z0000 Encounter for general adult medical examination without abnormal findings: Secondary | ICD-10-CM | POA: Diagnosis not present

## 2018-05-31 DIAGNOSIS — Z1159 Encounter for screening for other viral diseases: Secondary | ICD-10-CM | POA: Diagnosis not present

## 2018-05-31 DIAGNOSIS — E039 Hypothyroidism, unspecified: Secondary | ICD-10-CM | POA: Diagnosis not present

## 2018-06-07 DIAGNOSIS — Z0000 Encounter for general adult medical examination without abnormal findings: Secondary | ICD-10-CM | POA: Diagnosis not present

## 2018-06-07 DIAGNOSIS — F411 Generalized anxiety disorder: Secondary | ICD-10-CM | POA: Diagnosis not present

## 2018-06-07 DIAGNOSIS — E890 Postprocedural hypothyroidism: Secondary | ICD-10-CM | POA: Diagnosis not present

## 2018-11-07 DIAGNOSIS — Z23 Encounter for immunization: Secondary | ICD-10-CM | POA: Diagnosis not present

## 2019-05-30 ENCOUNTER — Ambulatory Visit: Payer: Self-pay | Attending: Internal Medicine

## 2019-05-30 DIAGNOSIS — Z23 Encounter for immunization: Secondary | ICD-10-CM | POA: Insufficient documentation

## 2019-05-30 NOTE — Progress Notes (Signed)
   Covid-19 Vaccination Clinic  Name:  RANDY CASTREJON    MRN: [id number] DOB: [birth year]  05/30/2019  Mr. Hinzman was observed post Covid-19 immunization for 15 minutes without incident. He was provided with Vaccine Information Sheet and instruction to access the V-Safe system.   Mr. Gillespie was instructed to call 911 with any severe reactions post vaccine: Marland Kitchen Difficulty breathing  . Swelling of face and throat  . A fast heartbeat  . A bad rash all over body  . Dizziness and weakness   Immunizations Administered    Name Date Dose VIS Date Route   Pfizer COVID-19 Vaccine 05/30/2019  9:42 AM 0.3 mL 03/08/2019 Intramuscular   Manufacturer: [employer], [employer]   Lot: [id number]   NDC: [address]-2

## 2019-06-25 ENCOUNTER — Ambulatory Visit: Payer: Self-pay | Attending: Internal Medicine

## 2019-06-25 DIAGNOSIS — Z23 Encounter for immunization: Secondary | ICD-10-CM

## 2019-06-25 NOTE — Progress Notes (Signed)
   Covid-19 Vaccination Clinic  Name:  Matthew Ayala    MRN: [id number] DOB: 1965-05-25  06/25/2019  Matthew Ayala was observed post Covid-19 immunization for 15 minutes without incident. He was provided with Vaccine Information Sheet and instruction to access the V-Safe system.   Matthew Ayala was instructed to call 911 with any severe reactions post vaccine: Marland Kitchen Difficulty breathing  . Swelling of face and throat  . A fast heartbeat  . A bad rash all over body  . Dizziness and weakness   Immunizations Administered    Name Date Dose VIS Date Route   Pfizer COVID-19 Vaccine 06/25/2019  2:20 PM 0.3 mL 03/08/2019 Intramuscular   Manufacturer: [employer], [employer]   Lot: [id number]   NDC: [address]-4

## 2019-06-26 DIAGNOSIS — Z13220 Encounter for screening for lipoid disorders: Secondary | ICD-10-CM | POA: Diagnosis not present

## 2019-06-26 DIAGNOSIS — Z0000 Encounter for general adult medical examination without abnormal findings: Secondary | ICD-10-CM | POA: Diagnosis not present

## 2019-06-26 DIAGNOSIS — E039 Hypothyroidism, unspecified: Secondary | ICD-10-CM | POA: Diagnosis not present

## 2019-07-03 DIAGNOSIS — N200 Calculus of kidney: Secondary | ICD-10-CM | POA: Diagnosis not present

## 2019-07-03 DIAGNOSIS — D485 Neoplasm of uncertain behavior of skin: Secondary | ICD-10-CM | POA: Diagnosis not present

## 2019-07-03 DIAGNOSIS — Z0001 Encounter for general adult medical examination with abnormal findings: Secondary | ICD-10-CM | POA: Diagnosis not present

## 2019-07-03 DIAGNOSIS — M8589 Other specified disorders of bone density and structure, multiple sites: Secondary | ICD-10-CM | POA: Diagnosis not present

## 2019-07-03 DIAGNOSIS — E890 Postprocedural hypothyroidism: Secondary | ICD-10-CM | POA: Diagnosis not present

## 2019-07-03 DIAGNOSIS — M40204 Unspecified kyphosis, thoracic region: Secondary | ICD-10-CM | POA: Diagnosis not present

## 2019-07-03 DIAGNOSIS — F411 Generalized anxiety disorder: Secondary | ICD-10-CM | POA: Diagnosis not present

## 2019-07-31 DIAGNOSIS — E7800 Pure hypercholesterolemia, unspecified: Secondary | ICD-10-CM | POA: Diagnosis not present

## 2019-07-31 DIAGNOSIS — D485 Neoplasm of uncertain behavior of skin: Secondary | ICD-10-CM | POA: Diagnosis not present

## 2019-07-31 DIAGNOSIS — M85852 Other specified disorders of bone density and structure, left thigh: Secondary | ICD-10-CM | POA: Diagnosis not present

## 2019-07-31 DIAGNOSIS — M85851 Other specified disorders of bone density and structure, right thigh: Secondary | ICD-10-CM | POA: Diagnosis not present

## 2019-08-28 DIAGNOSIS — L570 Actinic keratosis: Secondary | ICD-10-CM | POA: Diagnosis not present

## 2019-08-28 DIAGNOSIS — L905 Scar conditions and fibrosis of skin: Secondary | ICD-10-CM | POA: Diagnosis not present

## 2019-08-28 DIAGNOSIS — L821 Other seborrheic keratosis: Secondary | ICD-10-CM | POA: Diagnosis not present

## 2020-01-15 DIAGNOSIS — Z800 Family history of malignant neoplasm of digestive organs: Secondary | ICD-10-CM | POA: Diagnosis not present

## 2020-01-15 DIAGNOSIS — D123 Benign neoplasm of transverse colon: Secondary | ICD-10-CM | POA: Diagnosis not present

## 2020-01-15 DIAGNOSIS — K635 Polyp of colon: Secondary | ICD-10-CM | POA: Diagnosis not present

## 2020-02-05 DIAGNOSIS — M85852 Other specified disorders of bone density and structure, left thigh: Secondary | ICD-10-CM | POA: Diagnosis not present

## 2020-03-16 DIAGNOSIS — Z20822 Contact with and (suspected) exposure to covid-19: Secondary | ICD-10-CM | POA: Diagnosis not present
